# Patient Record
Sex: Female | Born: 1980 | Race: Black or African American | Hispanic: No | Marital: Single | Smoking: Current every day smoker
Health system: Southern US, Community
[De-identification: ages and names within clinical notes are randomized; demographics above are authoritative.]

## PROBLEM LIST (undated history)

## (undated) DIAGNOSIS — F329 Major depressive disorder, single episode, unspecified: Secondary | ICD-10-CM

## (undated) DIAGNOSIS — F419 Anxiety disorder, unspecified: Secondary | ICD-10-CM

## (undated) DIAGNOSIS — F32A Depression, unspecified: Secondary | ICD-10-CM

## (undated) HISTORY — PX: BREAST SURGERY: SHX581

---

## 2004-04-05 ENCOUNTER — Observation Stay: Payer: Self-pay | Admitting: Obstetrics & Gynecology

## 2004-04-19 ENCOUNTER — Inpatient Hospital Stay: Payer: Self-pay | Admitting: Unknown Physician Specialty

## 2004-05-11 ENCOUNTER — Emergency Department: Payer: Self-pay | Admitting: Internal Medicine

## 2004-05-23 ENCOUNTER — Emergency Department: Payer: Self-pay | Admitting: Emergency Medicine

## 2004-05-24 ENCOUNTER — Ambulatory Visit: Payer: Self-pay | Admitting: Emergency Medicine

## 2006-08-16 ENCOUNTER — Emergency Department: Payer: Self-pay | Admitting: Internal Medicine

## 2006-08-17 ENCOUNTER — Other Ambulatory Visit: Payer: Self-pay

## 2006-08-17 ENCOUNTER — Emergency Department: Payer: Self-pay

## 2007-04-21 ENCOUNTER — Emergency Department: Payer: Self-pay | Admitting: Unknown Physician Specialty

## 2007-07-13 ENCOUNTER — Emergency Department: Payer: Self-pay | Admitting: Emergency Medicine

## 2007-09-19 ENCOUNTER — Inpatient Hospital Stay: Payer: Self-pay | Admitting: Psychiatry

## 2015-05-29 ENCOUNTER — Emergency Department: Payer: Self-pay

## 2015-05-29 ENCOUNTER — Encounter: Payer: Self-pay | Admitting: Medical Oncology

## 2015-05-29 ENCOUNTER — Emergency Department
Admission: EM | Admit: 2015-05-29 | Discharge: 2015-05-29 | Disposition: A | Discharge status: Home / Self Care | Payer: Self-pay | Attending: Emergency Medicine | Admitting: Emergency Medicine

## 2015-05-29 DIAGNOSIS — S8992XA Unspecified injury of left lower leg, initial encounter: Secondary | ICD-10-CM | POA: Insufficient documentation

## 2015-05-29 DIAGNOSIS — Y9389 Activity, other specified: Secondary | ICD-10-CM | POA: Insufficient documentation

## 2015-05-29 DIAGNOSIS — Z880 Allergy status to penicillin: Secondary | ICD-10-CM | POA: Insufficient documentation

## 2015-05-29 DIAGNOSIS — Y998 Other external cause status: Secondary | ICD-10-CM | POA: Insufficient documentation

## 2015-05-29 DIAGNOSIS — S8991XA Unspecified injury of right lower leg, initial encounter: Secondary | ICD-10-CM | POA: Insufficient documentation

## 2015-05-29 DIAGNOSIS — M7918 Myalgia, other site: Secondary | ICD-10-CM

## 2015-05-29 DIAGNOSIS — S4992XA Unspecified injury of left shoulder and upper arm, initial encounter: Secondary | ICD-10-CM | POA: Insufficient documentation

## 2015-05-29 DIAGNOSIS — S3991XA Unspecified injury of abdomen, initial encounter: Secondary | ICD-10-CM | POA: Insufficient documentation

## 2015-05-29 DIAGNOSIS — S4991XA Unspecified injury of right shoulder and upper arm, initial encounter: Secondary | ICD-10-CM | POA: Insufficient documentation

## 2015-05-29 DIAGNOSIS — S0990XA Unspecified injury of head, initial encounter: Secondary | ICD-10-CM | POA: Insufficient documentation

## 2015-05-29 DIAGNOSIS — Y9289 Other specified places as the place of occurrence of the external cause: Secondary | ICD-10-CM | POA: Insufficient documentation

## 2015-05-29 DIAGNOSIS — Z3202 Encounter for pregnancy test, result negative: Secondary | ICD-10-CM | POA: Insufficient documentation

## 2015-05-29 LAB — URINALYSIS COMPLETE WITH MICROSCOPIC (ARMC ONLY)
BILIRUBIN URINE: NEGATIVE
GLUCOSE, UA: NEGATIVE mg/dL
Hgb urine dipstick: NEGATIVE
KETONES UR: NEGATIVE mg/dL
Leukocytes, UA: NEGATIVE
Nitrite: NEGATIVE
Protein, ur: NEGATIVE mg/dL
Specific Gravity, Urine: 1.018 (ref 1.005–1.030)
pH: 6 (ref 5.0–8.0)

## 2015-05-29 LAB — POCT PREGNANCY, URINE: Preg Test, Ur: NEGATIVE

## 2015-05-29 MED ORDER — NAPROXEN 500 MG PO TABS: 500.0000 mg | ORAL_TABLET | Freq: Two times a day (BID) | ORAL | Status: DC

## 2015-05-29 MED ORDER — NAPROXEN 500 MG PO TABS
500.0000 mg | ORAL_TABLET | Freq: Once | ORAL | Status: AC
  Administered 2015-05-29: 500 mg via ORAL
  Filled 2015-05-29: qty 1
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
## 2015-05-29 NOTE — Discharge Instructions (Signed)
Concussion, Adult °A concussion is a brain injury. It is caused by: °· A hit to the head. °· A quick and sudden movement (jolt) of the head or neck. °A concussion is usually not life threatening. Even so, it can cause serious problems. If you had a concussion before, you may have concussion-like problems after a hit to your head. °HOME CARE °General Instructions °· Follow your doctor's directions carefully. °· Take medicines only as told by your doctor. °· Only take medicines your doctor says are safe. °· Do not drink alcohol until your doctor says it is okay. Alcohol and some drugs can slow down healing. They can also put you at risk for further injury. °· If you are having trouble remembering things, write them down. °· Try to do one thing at a time if you get distracted easily. For example, do not watch TV while making dinner. °· Talk to your family members or close friends when making important decisions. °· Follow up with your doctor as told. °· Watch your symptoms. Tell others to do the same. Serious problems can sometimes happen after a concussion. Older adults are more likely to have these problems. °· Tell your teachers, school nurse, school counselor, coach, athletic trainer, or work manager about your concussion. Tell them about what you can or cannot do. They should watch to see if: °¨ It gets even harder for you to pay attention or concentrate. °¨ It gets even harder for you to remember things or learn new things. °¨ You need more time than normal to finish things. °¨ You become annoyed (irritable) more than before. °¨ You are not able to deal with stress as well. °¨ You have more problems than before. °· Rest. Make sure you: °¨ Get plenty of sleep at night. °¨ Go to sleep early. °¨ Go to bed at the same time every day. Try to wake up at the same time. °¨ Rest during the day. °¨ Take naps when you feel tired. °· Limit activities where you have to think a lot or concentrate. These include: °¨ Doing  homework. °¨ Doing work related to a job. °¨ Watching TV. °¨ Using the computer. °Returning To Your Regular Activities °Return to your normal activities slowly, not all at once. You must give your body and brain enough time to heal.  °· Do not play sports or do other athletic activities until your doctor says it is okay. °· Ask your doctor when you can drive, ride a bicycle, or work other vehicles or machines. Never do these things if you feel dizzy. °· Ask your doctor about when you can return to work or school. °Preventing Another Concussion °It is very important to avoid another brain injury, especially before you have healed. In rare cases, another injury can lead to permanent brain damage, brain swelling, or death. The risk of this is greatest during the first 7-10 days after your injury. Avoid injuries by:  °· Wearing a seat belt when riding in a car. °· Not drinking too much alcohol. °· Avoiding activities that could lead to a second concussion (such as contact sports). °· Wearing a helmet when doing activities like: °¨ Biking. °¨ Skiing. °¨ Skateboarding. °¨ Skating. °· Making your home safer by: °¨ Removing things from the floor or stairways that could make you trip. °¨ Using grab bars in bathrooms and handrails by stairs. °¨ Placing non-slip mats on floors and in bathtubs. °¨ Improve lighting in dark areas. °GET HELP IF: °·   It gets even harder for you to pay attention or concentrate. °· It gets even harder for you to remember things or learn new things. °· You need more time than normal to finish things. °· You become annoyed (irritable) more than before. °· You are not able to deal with stress as well. °· You have more problems than before. °· You have problems keeping your balance. °· You are not able to react quickly when you should. °Get help if you have any of these problems for more than 2 weeks:  °· Lasting (chronic) headaches. °· Dizziness or trouble balancing. °· Feeling sick to your stomach  (nausea). °· Seeing (vision) problems. °· Being affected by noises or light more than normal. °· Feeling sad, low, down in the dumps, blue, gloomy, or empty (depressed). °· Mood changes (mood swings). °· Feeling of fear or nervousness about what may happen (anxiety). °· Feeling annoyed. °· Memory problems. °· Problems concentrating or paying attention. °· Sleep problems. °· Feeling tired all the time. °GET HELP RIGHT AWAY IF:  °· You have bad headaches or your headaches get worse. °· You have weakness (even if it is in one hand, leg, or part of the face). °· You have loss of feeling (numbness). °· You feel off balance. °· You keep throwing up (vomiting). °· You feel tired. °· One black center of your eye (pupil) is larger than the other. °· You twitch or shake violently (convulse). °· Your speech is not clear (slurred). °· You are more confused, easily angered (agitated), or annoyed than before. °· You have more trouble resting than before. °· You are unable to recognize people or places. °· You have neck pain. °· It is difficult to wake you up. °· You have unusual behavior changes. °· You pass out (lose consciousness). °MAKE SURE YOU:  °· Understand these instructions. °· Will watch your condition. °· Will get help right away if you are not doing well or get worse. °  °This information is not intended to replace advice given to you by your health care provider. Make sure you discuss any questions you have with your health care provider. °  °Document Released: 06/01/2009 Document Revised: 07/04/2014 Document Reviewed: 01/03/2013 °Elsevier Interactive Patient Education ©2016 Elsevier Inc. ° °
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
## 2015-05-29 NOTE — ED Notes (Signed)
[facility] nurse is enroute to assess patient

## 2015-05-29 NOTE — ED Notes (Signed)
Assessment per PA 

## 2015-05-29 NOTE — ED Notes (Signed)
[facility] nurse here 
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
## 2015-10-18 ENCOUNTER — Emergency Department (HOSPITAL_COMMUNITY)
Admission: EM | Admit: 2015-10-18 | Discharge: 2015-10-18 | Disposition: A | Discharge status: Home / Self Care | Payer: Self-pay | Attending: Emergency Medicine | Admitting: Emergency Medicine

## 2015-10-18 ENCOUNTER — Emergency Department (HOSPITAL_COMMUNITY): Payer: Self-pay

## 2015-10-18 ENCOUNTER — Encounter (HOSPITAL_COMMUNITY): Payer: Self-pay | Admitting: Emergency Medicine

## 2015-10-18 DIAGNOSIS — R0789 Other chest pain: Secondary | ICD-10-CM | POA: Insufficient documentation

## 2015-10-18 DIAGNOSIS — Z79899 Other long term (current) drug therapy: Secondary | ICD-10-CM | POA: Insufficient documentation

## 2015-10-18 DIAGNOSIS — F17210 Nicotine dependence, cigarettes, uncomplicated: Secondary | ICD-10-CM | POA: Insufficient documentation

## 2015-10-18 DIAGNOSIS — F329 Major depressive disorder, single episode, unspecified: Secondary | ICD-10-CM | POA: Insufficient documentation

## 2015-10-18 DIAGNOSIS — Z791 Long term (current) use of non-steroidal anti-inflammatories (NSAID): Secondary | ICD-10-CM | POA: Insufficient documentation

## 2015-10-18 HISTORY — DX: Depression, unspecified: F32.A

## 2015-10-18 HISTORY — DX: Anxiety disorder, unspecified: F41.9

## 2015-10-18 HISTORY — DX: Major depressive disorder, single episode, unspecified: F32.9

## 2015-10-18 LAB — BASIC METABOLIC PANEL
Anion gap: 8 (ref 5–15)
BUN: 7 mg/dL (ref 6–20)
CALCIUM: 8.6 mg/dL — AB (ref 8.9–10.3)
CO2: 23 mmol/L (ref 22–32)
Chloride: 108 mmol/L (ref 101–111)
Creatinine, Ser: 0.63 mg/dL (ref 0.44–1.00)
GFR calc Af Amer: >60 mL/min (ref >60)
GLUCOSE: 85 mg/dL (ref 65–99)
Potassium: 3.3 mmol/L — ABNORMAL LOW (ref 3.5–5.1)
Sodium: 139 mmol/L (ref 135–145)

## 2015-10-18 LAB — CBC
HCT: 37 % (ref 36.0–46.0)
Hemoglobin: 12.6 g/dL (ref 12.0–15.0)
MCH: 31.7 pg (ref 26.0–34.0)
MCHC: 34.1 g/dL (ref 30.0–36.0)
MCV: 93.2 fL (ref 78.0–100.0)
PLATELETS: 229 10*3/uL (ref 150–400)
RBC: 3.97 MIL/uL (ref 3.87–5.11)
RDW: 13.5 % (ref 11.5–15.5)
WBC: 8.4 10*3/uL (ref 4.0–10.5)

## 2015-10-18 LAB — I-STAT TROPONIN, ED
TROPONIN I, POC: 0 ng/mL (ref 0.00–0.08)
Troponin i, poc: 0 ng/mL (ref 0.00–0.08)

## 2015-10-18 MED ORDER — NAPROXEN 500 MG PO TABS: 500.0000 mg | ORAL_TABLET | Freq: Two times a day (BID) | ORAL | Status: DC

## 2015-10-18 NOTE — ED Notes (Signed)
Provided patient a ginger ale.   

## 2015-10-18 NOTE — ED Notes (Signed)
Bed: [id number] Expected date:  Expected time:  Means of arrival:  Comments: EMS- CP with inspiration
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
## 2015-10-18 NOTE — Discharge Instructions (Signed)
There does not appear to be an emergent cause for your symptoms at this time. You may take Motrin or naproxen as prescribed to help with your discomfort. Your exam, labs, EKG and chest x-ray are all reassuring. Follow-up with your doctor this week for reevaluation. Return to ED for new or worsening symptoms as we discussed.  Chest Wall Pain Chest wall pain is pain in or around the bones and muscles of your chest. Sometimes, an injury causes this pain. Sometimes, the cause may not be known. This pain may take several weeks or longer to get better. HOME CARE INSTRUCTIONS  Pay attention to any changes in your symptoms. Take these actions to help with your pain:   Rest as told by your health care provider.   Avoid activities that cause pain. These include any activities that use your chest muscles or your abdominal and side muscles to lift heavy items.   If directed, apply ice to the painful area:  Put ice in a plastic bag.  Place a towel between your skin and the bag.  Leave the ice on for 20 minutes, 2-3 times per day.  Take over-the-counter and prescription medicines only as told by your health care provider.  Do not use tobacco products, including cigarettes, chewing tobacco, and e-cigarettes. If you need help quitting, ask your health care provider.  Keep all follow-up visits as told by your health care provider. This is important. SEEK MEDICAL CARE IF:  You have a fever.  Your chest pain becomes worse.  You have new symptoms. SEEK IMMEDIATE MEDICAL CARE IF:  You have nausea or vomiting.  You feel sweaty or light-headed.  You have a cough with phlegm (sputum) or you cough up blood.  You develop shortness of breath.   This information is not intended to replace advice given to you by your health care provider. Make sure you discuss any questions you have with your health care provider.   Document Released: 06/13/2005 Document Revised: 03/04/2015 Document Reviewed:  09/08/2014 Elsevier Interactive Patient Education [employer] Elsevier Inc.

## 2015-10-18 NOTE — ED Notes (Signed)
Patient transported to X-ray 

## 2015-10-18 NOTE — ED Provider Notes (Signed)
CSN: [id number]     Arrival date & time 10/18/15  1634 History   First MD Initiated Contact with Patient 10/18/15 1653     Chief Complaint  Patient presents with  . Chest Pain     (Consider location/radiation/quality/duration/timing/severity/associated sxs/prior Treatment) HPI Angel Clark is a 35 y.o. female history of anxiety and depression comes in for evaluation of chest pain. Patient reports rash at approximately 11:00 AM, she took her daughter to the [place] and started to participate in vigorous exercise patient began to experience central chest pressure with intermittent sharp pains. She reports this discomfort has been constant since 11:00 yesterday, worse with moving her arms and turning to the side. She also reports it was worse this morning when she was waking up. States she typically takes Seroquel and trazodone, but has not taken this medication in some time, but took it after her chest discomfort started. This was not helpful. No nausea, vomiting, shortness of breath, numbness or weakness, dizziness. She does report smoking 5 cigarettes per day. She does report cardiac history on her mom's side. She denies any discomfort now in the emergency department. No recent travel, surgeries, unilateral leg swelling, OCP use, history of blood clot, hemoptysis.  Past Medical History  Diagnosis Date  . Anxiety   . Depression    Past Surgical History  Procedure Laterality Date  . Breast surgery     Family History  Problem Relation Age of Onset  . Family history unknown: Yes   Social History  Substance Use Topics  . Smoking status: Never Smoker   . Smokeless tobacco: None  . Alcohol Use: No   OB History    No data available     Review of Systems A 10 point review of systems was completed and was negative except for pertinent positives and negatives as mentioned in the history of present illness     Allergies  Penicillins and Hydrocodone  Home Medications   Prior to  Admission medications   Medication Sig Start Date End Date Taking? Authorizing Provider  CALCIUM PO Take 1 tablet by mouth daily.   Yes Historical Provider, MD  QUEtiapine (SEROQUEL) 100 MG tablet Take 100 mg by mouth daily as needed (sleep).    Yes Historical Provider, MD  traZODone (DESYREL) 50 MG tablet Take 50 mg by mouth at bedtime.   Yes Historical Provider, MD  naproxen (NAPROSYN) 500 MG tablet Take 1 tablet (500 mg total) by mouth 2 (two) times daily. 10/18/15   Joycie Peek, PA-C   BP 127/82 mmHg  Pulse 66  Temp(Src) 97.7 F (36.5 C) (Oral)  Resp 20  Ht  (1.676 m)  Wt 140.615 kg  BMI 50.06 kg/m2  SpO2 98%  LMP 09/30/2015 Physical Exam  Constitutional: She is oriented to person, place, and time. She appears well-developed and well-nourished.  Obese African-American female.  HENT:  Head: Normocephalic and atraumatic.  Mouth/Throat: Oropharynx is clear and moist.  Eyes: Conjunctivae are normal. Pupils are equal, round, and reactive to light. Right eye exhibits no discharge. Left eye exhibits no discharge. No scleral icterus.  Neck: Neck supple.  Cardiovascular: Normal rate, regular rhythm and normal heart sounds.   Pulmonary/Chest: Effort normal and breath sounds normal. No respiratory distress. She has no wheezes. She has no rales. She exhibits tenderness.  Discomfort is replicated when patient tries to sit up in the bed and with moving her arms. Also diffuse tenderness to palpation over sternum and left intercostal spaces.  Abdominal: Soft.  There is no tenderness.  Musculoskeletal: She exhibits no tenderness.  Neurological: She is alert and oriented to person, place, and time.  Cranial Nerves II-XII grossly intact  Skin: Skin is warm and dry. No rash noted.  Psychiatric: She has a normal mood and affect.  Nursing note and vitals reviewed.   ED Course  Procedures (including critical care time) Labs Review Labs Reviewed  BASIC METABOLIC PANEL - Abnormal; Notable  for the following:    Potassium 3.3 (*)    Calcium 8.6 (*)    All other components within normal limits  CBC  I-STAT TROPOININ, ED  I-STAT TROPOININ, ED    Imaging Review Dg Chest 2 View  10/18/2015  CLINICAL DATA:  Weakness with dizziness, cough, shortness of breath, nausea and vomiting for 3 days. EXAM: CHEST  2 VIEW COMPARISON:  Radiographs and CT 08/17/2006. FINDINGS: The heart size and mediastinal contours are normal. The lungs are clear. There is no pleural effusion or pneumothorax. No acute osseous findings are identified. Telemetry leads overlie the chest. IMPRESSION: Stable chest.  No active cardiopulmonary process. Electronically Signed   By: Carey BullocksWilliam  Veazey M.D.   On: 10/18/2015 18:12   I have personally reviewed and evaluated these images and lab results as part of my medical decision-making.   EKG Interpretation None       Date: 10/18/2015  Rate: 70  Rhythm: normal sinus rhythm  QRS Axis: normal  Intervals: normal  ST/T Wave abnormalities: normal  Conduction Disutrbances: none  Narrative Interpretation:   Old EKG Reviewed: No significant changes noted    Meds given in ED:  Medications - No data to display  New Prescriptions   NAPROXEN (NAPROSYN) 500 MG TABLET    Take 1 tablet (500 mg total) by mouth 2 (two) times daily.   Filed Vitals:   10/18/15 1649 10/18/15 1700 10/18/15 1730 10/18/15 1854  BP: 115/75   127/82  Pulse: 68 71 71 66  Temp: 97.7 F (36.5 C)   97.7 F (36.5 C)  TempSrc:    Oral  Resp: 18 25 21 20   Height: 5\' 6"  (1.676 m)     Weight: 140.615 kg     SpO2: 97% 97% 98% 98%    MDM  Vitals stable - WNL -afebrile Pt resting comfortably in ED. No chest discomfort in ED PE--Lung exam unremarkable. Cardiac auscultation reveals no murmurs rubs or gallops. Discomfort is replicated when patient tries to sit up in the bed and also has chest wall tenderness. Otherwise Grossly Benign Physical Exam Labwork: DeltaTroponin negative. EKG reassuring.  Labs otherwise noncontributory Imaging: CXR showsNo acute cardio pulmonary pathology.  DDX: Patient with chest discomfort likely due to MSK. Clinical picture and exam today not consistent with ACS/dissection. Heart score 3. No evidence of spontaneous pneumothorax, esophageal rupture or other mediastinitis. PERC negative, doubt PE. No evidence of myocarditis, endocarditis, pericarditis. Upon reevaluation at 20:00, patient is resting comfortably in exam bed, no apparent distress. Will DC with NSAIDs trial. I discussed all relevant lab findings and imaging results with pt and they verbalized understanding. Discussed f/u with PCP within 48 hrs and return precautions, pt very amenable to plan.   Final diagnoses:  Chest wall pain        Joycie PeekBenjamin Luvada Salamone, PA-C 10/18/15 2159  Loren Raceravid Yelverton, MD 10/26/15 2252

## 2015-10-18 NOTE — ED Notes (Signed)
Pt arrived via EMS with report of intermittent chest pain since yesterday while walking radiating to lt chest, denies n/v, SHOB, worse with deep palpation and deep breathing, improvement with movement, pt noted with snoring resp and sleeping during triaging. Pt reported to EMS that she feels anxious.

## 2018-03-31 ENCOUNTER — Other Ambulatory Visit: Payer: Self-pay

## 2018-03-31 ENCOUNTER — Emergency Department (HOSPITAL_COMMUNITY)
Admission: EM | Admit: 2018-03-31 | Discharge: 2018-03-31 | Disposition: A | Discharge status: Home / Self Care | Payer: Self-pay | Attending: Emergency Medicine | Admitting: Emergency Medicine

## 2018-03-31 ENCOUNTER — Encounter (HOSPITAL_COMMUNITY): Payer: Self-pay

## 2018-03-31 DIAGNOSIS — J45909 Unspecified asthma, uncomplicated: Secondary | ICD-10-CM | POA: Insufficient documentation

## 2018-03-31 DIAGNOSIS — R300 Dysuria: Secondary | ICD-10-CM | POA: Insufficient documentation

## 2018-03-31 DIAGNOSIS — Z79899 Other long term (current) drug therapy: Secondary | ICD-10-CM | POA: Insufficient documentation

## 2018-03-31 DIAGNOSIS — M791 Myalgia, unspecified site: Secondary | ICD-10-CM | POA: Insufficient documentation

## 2018-03-31 DIAGNOSIS — R0981 Nasal congestion: Secondary | ICD-10-CM | POA: Insufficient documentation

## 2018-03-31 DIAGNOSIS — R05 Cough: Secondary | ICD-10-CM | POA: Insufficient documentation

## 2018-03-31 DIAGNOSIS — Z202 Contact with and (suspected) exposure to infections with a predominantly sexual mode of transmission: Secondary | ICD-10-CM | POA: Insufficient documentation

## 2018-03-31 DIAGNOSIS — R059 Cough, unspecified: Secondary | ICD-10-CM

## 2018-03-31 MED ORDER — HYDROXYZINE HCL 25 MG PO TABS: 25.0000 mg | ORAL_TABLET | Freq: Every day | ORAL | 0 refills | Status: AC

## 2018-03-31 MED ORDER — CYCLOBENZAPRINE HCL 10 MG PO TABS: 10.0000 mg | ORAL_TABLET | Freq: Two times a day (BID) | ORAL | 0 refills | Status: AC | PRN

## 2018-03-31 NOTE — Discharge Instructions (Addendum)
Please call number on d/c for primary call follow up Use tylenol, light exercise, and heat therapy for pain Use albuterol inhaler two puffs every four hours Follow up with health department for concern of sexually transmitted infections

## 2018-03-31 NOTE — ED Notes (Signed)
ED Provider at bedside. 
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
## 2018-05-11 ENCOUNTER — Encounter (HOSPITAL_COMMUNITY): Payer: Self-pay | Admitting: Emergency Medicine

## 2018-05-11 ENCOUNTER — Other Ambulatory Visit: Payer: Self-pay

## 2018-05-11 ENCOUNTER — Emergency Department (HOSPITAL_COMMUNITY): Payer: Self-pay

## 2018-05-11 ENCOUNTER — Emergency Department (HOSPITAL_COMMUNITY)
Admission: EM | Admit: 2018-05-11 | Discharge: 2018-05-11 | Disposition: A | Discharge status: Home / Self Care | Payer: Self-pay | Attending: Emergency Medicine | Admitting: Emergency Medicine

## 2018-05-11 DIAGNOSIS — Z79899 Other long term (current) drug therapy: Secondary | ICD-10-CM | POA: Insufficient documentation

## 2018-05-11 DIAGNOSIS — F17210 Nicotine dependence, cigarettes, uncomplicated: Secondary | ICD-10-CM | POA: Insufficient documentation

## 2018-05-11 DIAGNOSIS — R109 Unspecified abdominal pain: Secondary | ICD-10-CM | POA: Insufficient documentation

## 2018-05-11 LAB — COMPREHENSIVE METABOLIC PANEL
ALBUMIN: 3.4 g/dL — AB (ref 3.5–5.0)
ALT: 51 U/L — AB (ref 0–44)
AST: 32 U/L (ref 15–41)
Alkaline Phosphatase: 48 U/L (ref 38–126)
Anion gap: 5 (ref 5–15)
BILIRUBIN TOTAL: 0.5 mg/dL (ref 0.3–1.2)
BUN: 10 mg/dL (ref 6–20)
CALCIUM: 8.7 mg/dL — AB (ref 8.9–10.3)
CO2: 26 mmol/L (ref 22–32)
CREATININE: 0.69 mg/dL (ref 0.44–1.00)
Chloride: 109 mmol/L (ref 98–111)
GFR calc Af Amer: >60 mL/min (ref >60)
GLUCOSE: 95 mg/dL (ref 70–99)
Potassium: 4.1 mmol/L (ref 3.5–5.1)
SODIUM: 140 mmol/L (ref 135–145)
TOTAL PROTEIN: 6.3 g/dL — AB (ref 6.5–8.1)

## 2018-05-11 LAB — CBC
HCT: 40.5 % (ref 36.0–46.0)
HEMOGLOBIN: 12.8 g/dL (ref 12.0–15.0)
MCH: 32.1 pg (ref 26.0–34.0)
MCHC: 31.6 g/dL (ref 30.0–36.0)
MCV: 101.5 fL — ABNORMAL HIGH (ref 80.0–100.0)
NRBC: 0 % (ref 0.0–0.2)
PLATELETS: 223 10*3/uL (ref 150–400)
RBC: 3.99 MIL/uL (ref 3.87–5.11)
RDW: 12.9 % (ref 11.5–15.5)
WBC: 5.6 10*3/uL (ref 4.0–10.5)

## 2018-05-11 LAB — URINALYSIS, ROUTINE W REFLEX MICROSCOPIC
BACTERIA UA: NONE SEEN
BILIRUBIN URINE: NEGATIVE
Glucose, UA: NEGATIVE mg/dL
Ketones, ur: NEGATIVE mg/dL
LEUKOCYTES UA: NEGATIVE
Nitrite: NEGATIVE
PH: 6 (ref 5.0–8.0)
Protein, ur: NEGATIVE mg/dL
SPECIFIC GRAVITY, URINE: 1.016 (ref 1.005–1.030)

## 2018-05-11 LAB — LIPASE, BLOOD: LIPASE: 27 U/L (ref 11–51)

## 2018-05-11 MED ORDER — SODIUM CHLORIDE 0.9 % IV BOLUS
1000.0000 mL | Freq: Once | INTRAVENOUS | Status: AC
  Administered 2018-05-11: 1000 mL via INTRAVENOUS

## 2018-05-11 MED ORDER — IOPAMIDOL (ISOVUE-300) INJECTION 61%
INTRAVENOUS | Status: AC
  Filled 2018-05-11: qty 100

## 2018-05-11 MED ORDER — MORPHINE SULFATE (PF) 4 MG/ML IV SOLN
4.0000 mg | Freq: Once | INTRAVENOUS | Status: AC
  Administered 2018-05-11: 4 mg via INTRAVENOUS
  Filled 2018-05-11: qty 1

## 2018-05-11 MED ORDER — IOPAMIDOL (ISOVUE-300) INJECTION 61%
100.0000 mL | Freq: Once | INTRAVENOUS | Status: AC | PRN
  Administered 2018-05-11: 100 mL via INTRAVENOUS

## 2018-05-11 NOTE — ED Triage Notes (Addendum)
Pt c/o right side abd pain that has been going on for over month with loose, watery stools. Last BM was about 7 days ago. Reports that she doesn't urinate as much during the day but at night voids a lot. Denies n/v.

## 2018-05-11 NOTE — Discharge Instructions (Addendum)
Take ibuprofen and tylenol as needed for pain  Increase your intake of daily water  Eat a whole foods based diet  Avoid soda and fast food

## 2018-05-11 NOTE — ED Notes (Signed)
Patient transported to CT 

## 2018-05-11 NOTE — ED Provider Notes (Signed)
[facility] Provider Note   CSN: [id number] Arrival date & time: 05/11/18  0751     History   Chief Complaint Chief Complaint  Patient presents with  . Abdominal Pain    HPI Angel Clark is a 37 y.o. female.  HPI Patient is a 37 year old female presents to the emergency department complaints of 1 month of worsening right-sided abdominal pain.  She is had some diarrhea and loose stools.  She feels like she is not urinating as much during the day.  No fevers or chills.  No nausea or vomiting.  Some decreased bowel movement in the past several days.  No history of constipation.   Past Medical History:  Diagnosis Date  . Anxiety   . Depression     There are no active problems to display for this patient.   Past Surgical History:  Procedure Laterality Date  . BREAST SURGERY       OB History   None      Home Medications    Prior to Admission medications   Medication Sig Start Date End Date Taking? Authorizing Provider  cyclobenzaprine (FLEXERIL) 10 MG tablet Take 1 tablet (10 mg total) by mouth 2 (two) times daily as needed for muscle spasms. 03/31/18  Yes Margarita Grizzleay, Danielle, MD  hydrOXYzine (ATARAX/VISTARIL) 25 MG tablet Take 1 tablet (25 mg total) by mouth at bedtime. 03/31/18  Yes Margarita Grizzleay, Danielle, MD    Family History Family History  Family history unknown: Yes    Social History Social History   Tobacco Use  . Smoking status: Current Every Day Smoker    Types: Cigarettes  . Smokeless tobacco: Never Used  Substance Use Topics  . Alcohol use: No  . Drug use: No     Allergies   Penicillins and Hydrocodone   Review of Systems Review of Systems  All other systems reviewed and are negative.    Physical Exam Updated Vital Signs BP (!) 141/93 (BP Location: Right Arm)   Pulse 68   Resp 18   LMP 05/03/2018 Comment: pt. denies pregnancy  SpO2 100%   Physical Exam  Constitutional: She is oriented to person,  place, and time. She appears well-developed and well-nourished. No distress.  HENT:  Head: Normocephalic and atraumatic.  Eyes: EOM are normal.  Neck: Normal range of motion.  Cardiovascular: Normal rate, regular rhythm and normal heart sounds.  Pulmonary/Chest: Effort normal and breath sounds normal.  Abdominal: Soft. She exhibits no distension.  Mild generalized right-sided abdominal tenderness without guarding or rebound.  Musculoskeletal: Normal range of motion.  Neurological: She is alert and oriented to person, place, and time.  Skin: Skin is warm and dry.  Psychiatric: She has a normal mood and affect. Judgment normal.  Nursing note and vitals reviewed.    ED Treatments / Results  Labs (all labs ordered are listed, but only abnormal results are displayed) Labs Reviewed  CBC - Abnormal; Notable for the following components:      Result Value   MCV 101.5 (*)    All other components within normal limits  COMPREHENSIVE METABOLIC PANEL - Abnormal; Notable for the following components:   Calcium 8.7 (*)    Total Protein 6.3 (*)    Albumin 3.4 (*)    ALT 51 (*)    All other components within normal limits  URINALYSIS, ROUTINE W REFLEX MICROSCOPIC - Abnormal; Notable for the following components:   APPearance HAZY (*)    Hgb urine dipstick MODERATE (*)  All other components within normal limits  LIPASE, BLOOD    EKG None  Radiology Ct Abdomen Pelvis W Contrast  Result Date: 05/11/2018 CLINICAL DATA:  Acute right abdominal pain EXAM: CT ABDOMEN AND PELVIS WITH CONTRAST TECHNIQUE: Multidetector CT imaging of the abdomen and pelvis was performed using the standard protocol following bolus administration of intravenous contrast. CONTRAST:  ISOVUE-300 IOPAMIDOL (ISOVUE-300) INJECTION 61% COMPARISON:  None. FINDINGS: Lower chest: No acute abnormality. Hepatobiliary: No focal liver abnormality is seen. No gallstones, gallbladder wall thickening, or biliary dilatation.  Pancreas: Unremarkable. No pancreatic ductal dilatation or surrounding inflammatory changes. Spleen: Normal in size without focal abnormality. Adrenals/Urinary Tract: Adrenal glands are unremarkable. Kidneys are normal, without renal calculi, focal lesion, or hydronephrosis. Bladder is unremarkable. Stomach/Bowel: Negative for bowel obstruction, significant dilatation, ileus, or free air. No fluid collection or abscess. Appendix not visualized. Vascular/Lymphatic: No significant vascular findings are present. No enlarged abdominal or pelvic lymph nodes. Reproductive: Uterine fibroids noted, largest fibroid is fundal location measuring 6 cm, image 56 series 2. Prominent ovaries with small follicles suspected bilaterally. No pelvic free fluid, fluid collection, hemorrhage or hematoma. No pelvic abscess. Other: No abdominal wall hernia or abnormality. No abdominopelvic ascites. Musculoskeletal: No acute or significant osseous findings. IMPRESSION: No acute intra-abdominal or pelvic finding by CT. Appendix not visualized No acute inflammatory process or abscess. Uterine fibroids, largest is fundal in location and 6 cm. Electronically Signed   By: Judie Petit.  Shick M.D.   On: 05/11/2018 10:35    Procedures Procedures (including critical care time)  Medications Ordered in ED Medications  iopamidol (ISOVUE-300) 61 % injection (has no administration in time range)  morphine 4 MG/ML injection 4 mg (4 mg Intravenous Given 05/11/18 0905)  sodium chloride 0.9 % bolus 1,000 mL (1,000 mLs Intravenous New Bag/Given (Non-Interop) 05/11/18 0904)  iopamidol (ISOVUE-300) 61 % injection 100 mL (100 mLs Intravenous Contrast Given 05/11/18 1004)     Initial Impression / Assessment and Plan / ED Course  I have reviewed the triage vital signs and the nursing notes.  Pertinent labs & imaging results that were available during my care of the patient were reviewed by me and considered in my medical decision making (see chart for  details).     Overall well-appearing.  CT scan without clear etiology for the patient's abdominal pain.  She does have uterine fibroids.  This can be followed up with her gynecologist.  This could be causing some discomfort and pressure and should be appropriately evaluated.  Recommend stool softeners for ongoing improvement in her bowel regimen.  Primary care follow-up.  Final Clinical Impressions(s) / ED Diagnoses   Final diagnoses:  Abdominal pain, unspecified abdominal location    ED Discharge Orders    None       Azalia Bilis, MD 05/11/18 1050

## 2019-08-20 IMAGING — CT CT ABD-PELV W/ CM
2 of 4 series · 17 of 46 positions shown, 19 images · IV contrast (ISOVUE)
Comparison: None.

CLINICAL DATA: Acute right abdominal pain

EXAM:
CT ABDOMEN AND PELVIS WITH CONTRAST
TECHNIQUE: Multidetector CT imaging of the abdomen and pelvis was performed
using the standard protocol following bolus administration of
intravenous contrast.
CONTRAST:  100mL Z5X35S-FSS IOPAMIDOL (Z5X35S-FSS) INJECTION 61%

[Series 2: axial st · axial · 0.81mm/px · z∈[-482,-62]mm · 14 of 96 slices shown, 16 images]
[im 6/96  soft-tissue]
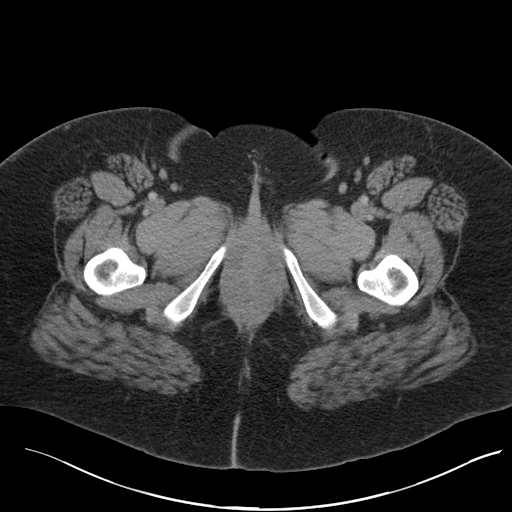
[im 6/96  bone]
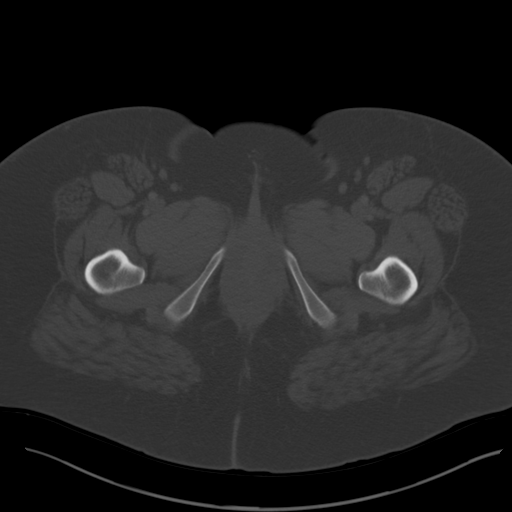
[im 12/96  soft-tissue]
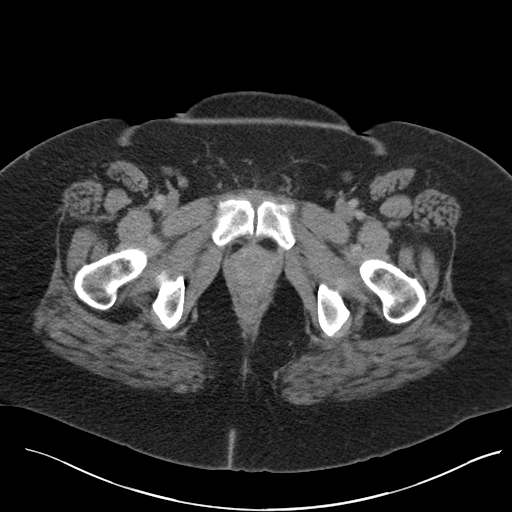
[im 18/96  soft-tissue]
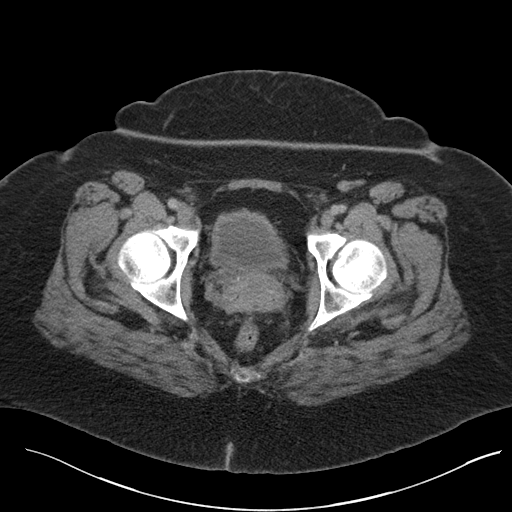
[im 24/96  soft-tissue]
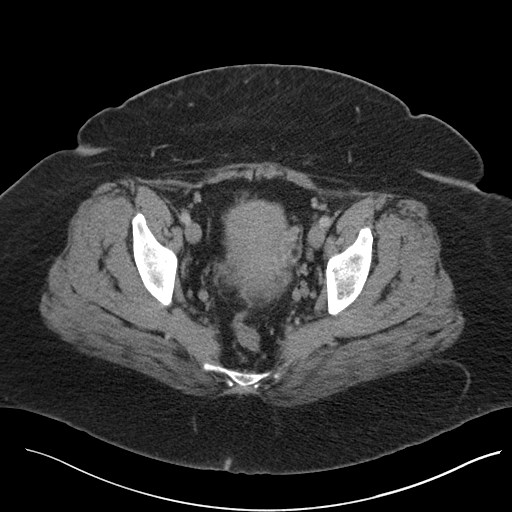
[im 30/96  soft-tissue]
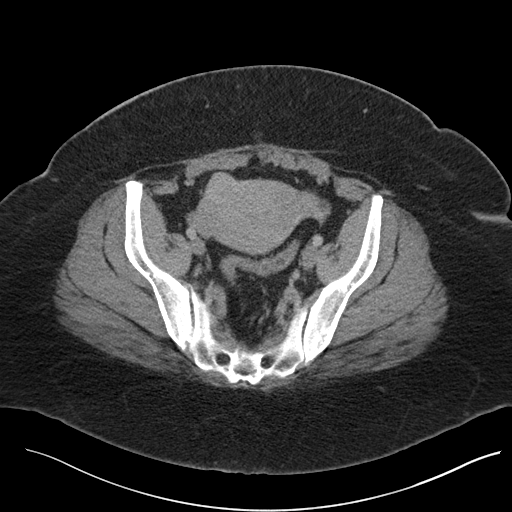
[im 36/96  soft-tissue]
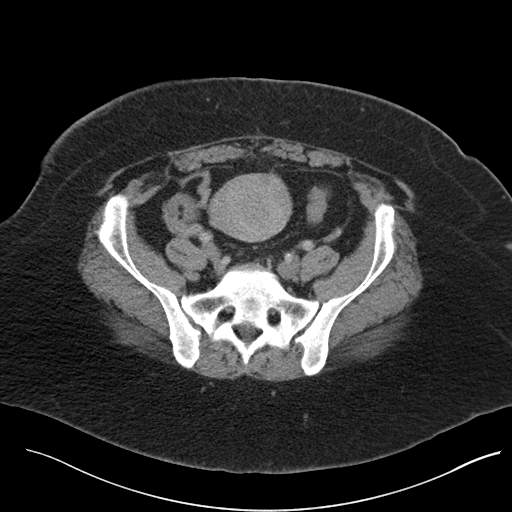
[im 42/96  soft-tissue]
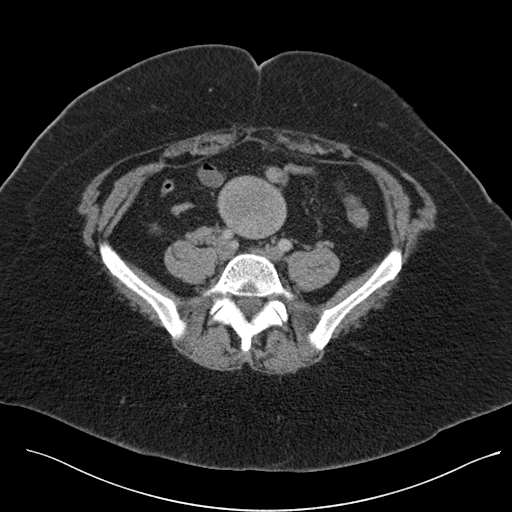
[im 54/96  soft-tissue]
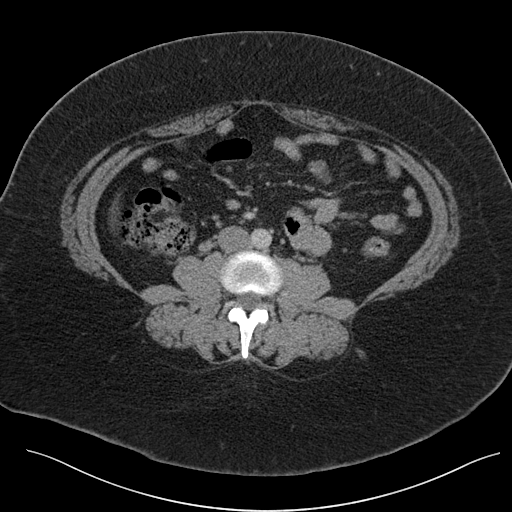
[im 60/96  soft-tissue]
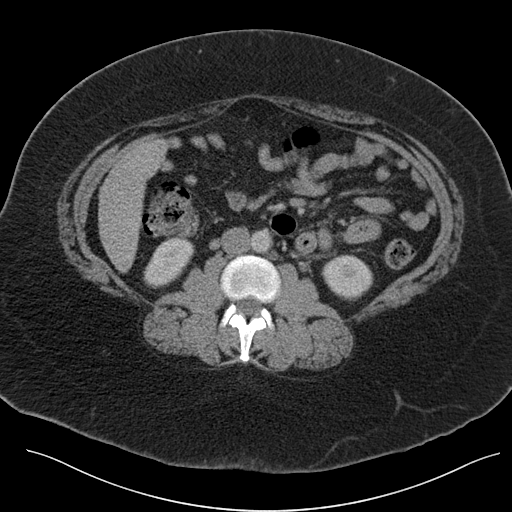
[im 60/96  bone]
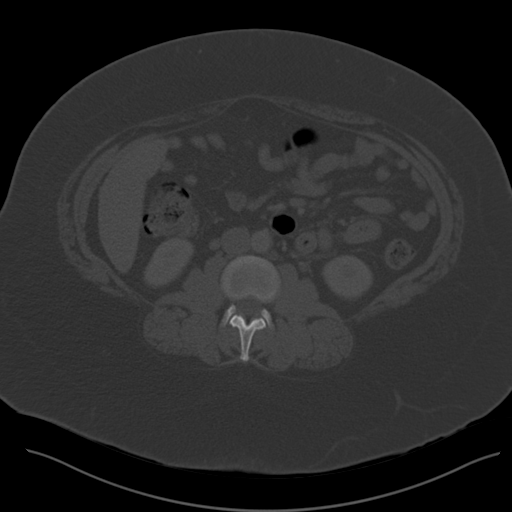
[im 66/96  soft-tissue]
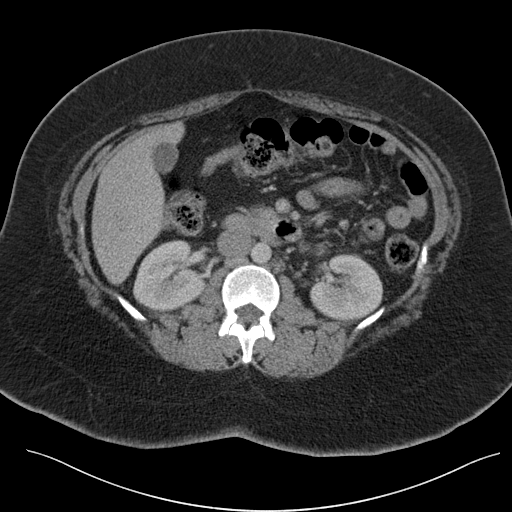
[im 72/96  soft-tissue]
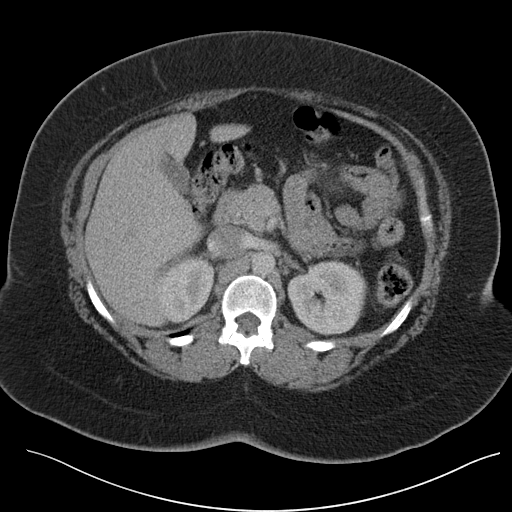
[im 78/96  soft-tissue]
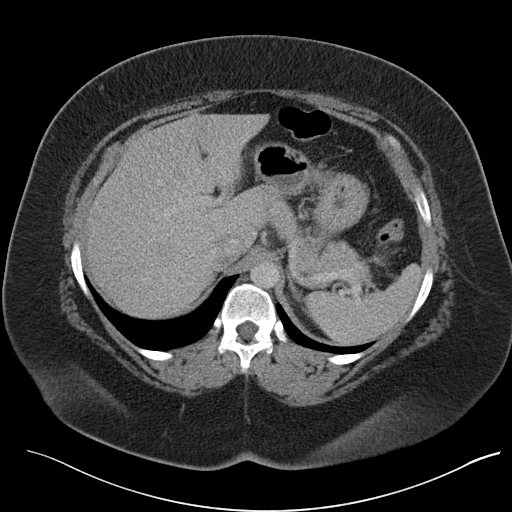
[im 84/96  soft-tissue]
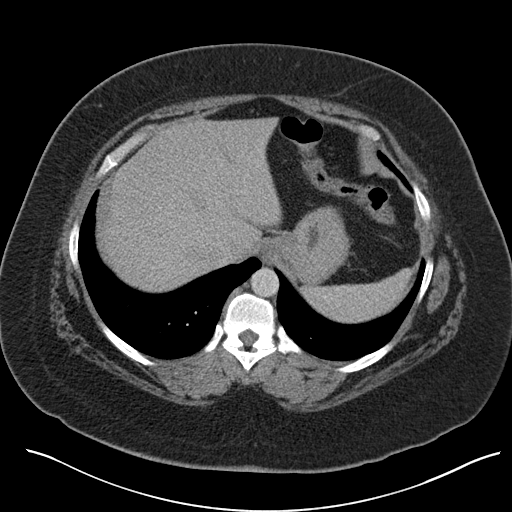
[im 90/96  soft-tissue]
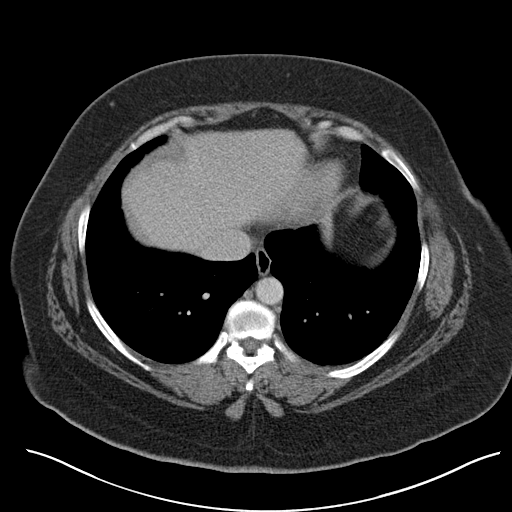

[Series 5: coronal st · coronal · 0.98mm/px · 3 of 138 slices shown]
[im 46/138  soft-tissue]
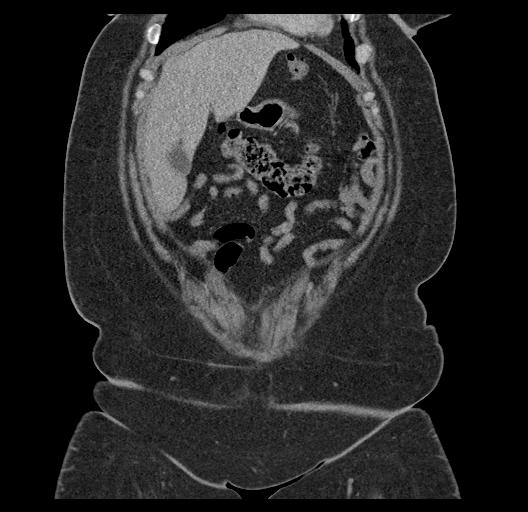
[im 61/138  soft-tissue]
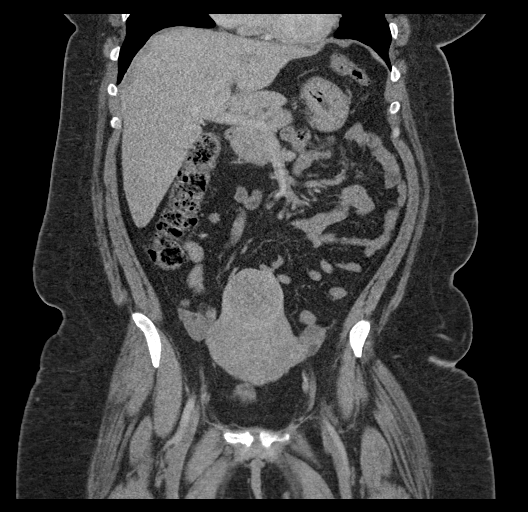
[im 77/138  soft-tissue]
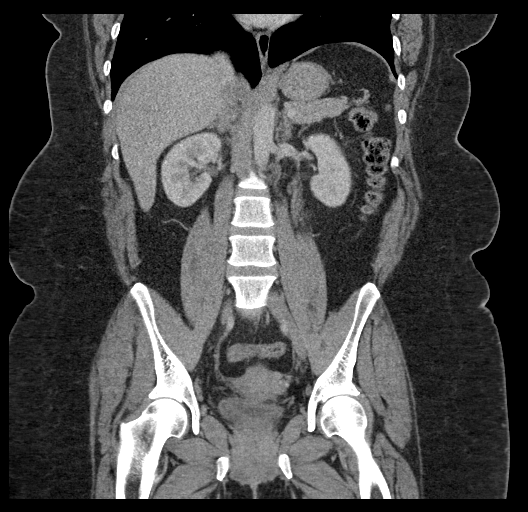

[17 of 46 positions shown; findings below may reference images not displayed]

FINDINGS: Lower chest: No acute abnormality.

Hepatobiliary: No focal liver abnormality is seen. No gallstones,
gallbladder wall thickening, or biliary dilatation.

Pancreas: Unremarkable. No pancreatic ductal dilatation or
surrounding inflammatory changes.

Spleen: Normal in size without focal abnormality.

Adrenals/Urinary Tract: Adrenal glands are unremarkable. Kidneys are
normal, without renal calculi, focal lesion, or hydronephrosis.
Bladder is unremarkable.

Stomach/Bowel: Negative for bowel obstruction, significant
dilatation, ileus, or free air. No fluid collection or abscess.
Appendix not visualized.

Vascular/Lymphatic: No significant vascular findings are present. No
enlarged abdominal or pelvic lymph nodes.

Reproductive: Uterine fibroids noted, largest fibroid is fundal
location measuring 6 cm, image 56 series 2. Prominent ovaries with
small follicles suspected bilaterally. No pelvic free fluid, fluid
collection, hemorrhage or hematoma. No pelvic abscess.

Other: No abdominal wall hernia or abnormality. No abdominopelvic
ascites.

Musculoskeletal: No acute or significant osseous findings.
IMPRESSION: No acute intra-abdominal or pelvic finding by CT.

Appendix not visualized

No acute inflammatory process or abscess.

Uterine fibroids, largest is fundal in location and 6 cm.
